# Patient Record
Sex: Male | Born: 1995 | Race: White | Hispanic: No | Marital: Single | Smoking: Current every day smoker
Health system: Southern US, Community
[De-identification: ages and names within clinical notes are randomized; demographics above are authoritative.]

## PROBLEM LIST (undated history)

## (undated) DIAGNOSIS — F329 Major depressive disorder, single episode, unspecified: Secondary | ICD-10-CM

## (undated) DIAGNOSIS — F32A Depression, unspecified: Secondary | ICD-10-CM

## (undated) DIAGNOSIS — F988 Other specified behavioral and emotional disorders with onset usually occurring in childhood and adolescence: Secondary | ICD-10-CM

## (undated) HISTORY — DX: Major depressive disorder, single episode, unspecified: F32.9

## (undated) HISTORY — DX: Other specified behavioral and emotional disorders with onset usually occurring in childhood and adolescence: F98.8

## (undated) HISTORY — DX: Depression, unspecified: F32.A

---
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
## 2016-05-15 ENCOUNTER — Encounter: Payer: Self-pay | Admitting: Pediatrics

## 2016-05-15 ENCOUNTER — Ambulatory Visit (INDEPENDENT_AMBULATORY_CARE_PROVIDER_SITE_OTHER): Payer: Medicaid Other | Admitting: Pediatrics

## 2016-05-15 VITALS — BP 114/76 | HR 94 | Temp 97.7°F | Ht 68.0 in | Wt 285.6 lb

## 2016-05-15 DIAGNOSIS — R234 Changes in skin texture: Secondary | ICD-10-CM | POA: Diagnosis not present

## 2016-05-15 NOTE — Patient Instructions (Signed)
Use benadryl 25 mg two times a day for the swelling Can also try generic anti-histamine allergy medication, ibuprofen 400mg  three times a day if swelling If eye redness worsens, swelling worsens, come back to be seen

## 2016-05-15 NOTE — Progress Notes (Signed)
Subjective:   Patient ID: Bruce Snyder, male    DOB: 19-Nov-1995, 20 y.o.   MRN: [id number] CC: New Patient (Initial Visit) and Knot on forehead  HPI: Bruce Snyder is a 20 y.o. male presenting for New Patient (Initial Visit) and Knot on forehead  Knot not there when he woke up Appeared in the afternoon Itching when it first came on Had similar bumps a month ago No fevers No URI symptoms Generally feelin gfine This morning when he woke up eye was swollen almost shut but much better now No longer itchy Did scratch at it yesterday  Generally healthy Mood has been fine  Does snore, no breathing pauses per mom Graduated high school 2017 Not working yet  Depression screen PHQ 2/9 05/15/2016  Decreased Interest 0  Down, Depressed, Hopeless 0  PHQ - 2 Score 0                                                      Past Medical History:  Diagnosis Date  . ADD (attention deficit disorder)   . Depression    History reviewed. No pertinent family history. Social History   Social History  . Marital status: Single    Spouse name: N/A  . Number of children: N/A  . Years of education: N/A   Social History Main Topics  . Smoking status: Current Every Day Smoker    Packs/day: 0.25    Types: Cigarettes  . Smokeless tobacco: Never Used  . Alcohol use No  . Drug use: No  . Sexual activity: Not Asked   Other Topics Concern  . None   Social History Narrative  . None   ROS: All systems negative other than what is in HPI  Objective:    BP 114/76   Pulse 94   Temp 97.7 F (36.5 C) (Oral)   Ht 5\' 8"  (1.727 m)   Wt 285 lb 9.6 oz (129.5 kg)   BMI 43.43 kg/m   Wt Readings from Last 3 Encounters:  05/15/16 285 lb 9.6 oz (129.5 kg) (>99 %, Z > 2.33)*   * Growth percentiles are based on CDC 2-20 Years data.    Gen: NAD, alert, cooperative with exam, NCAT EYES: EOMI, no conjunctival injection, or no icterus ENT:  TMs pearly gray b/l, OP without erythema, tonsils present LYMPH:  no cervical LAD CV: NRRR, normal S1/S2, no murmur, distal pulses 2+ b/l Resp: CTABL, no wheezes, normal WOB Neuro: Alert and oriented, strength equal b/l UE and LE, coordination grossly normal MSK: normal muscle bulk Skin: upper R forehead over eye brow with two separate areas, one apprx 1 cm across closest to eye brow, above that one less than 1 cm, superficial induration, no fluctuance, no tenderness. No hardness or redness. Shallow abrasion vs excoriation present lower area of induration.  4mm red scab below middle of lower lip  Assessment & Plan:  Smayan was seen today for new patient (initial visit) and knot on forehead.  Diagnoses and all orders for this visit:  Induration of skin Likely a bug bite given itching when he first noticed it Swelling much improved now Has not used anything on it Does not appear infected Normal EOM, no pain with eye ROM Can try benadryl, ibuprofen If redness, swelling return or worsen, needs to be seen to rul eout  pre-septal cellulitis  Follow up plan: Return in about 4 weeks (around 06/12/2016) for CPE. Rex Krasarol Tahira Olivarez, MD [facility]

## 2016-06-16 ENCOUNTER — Encounter: Payer: Medicaid Other | Admitting: Pediatrics

## 2016-12-04 ENCOUNTER — Ambulatory Visit: Payer: Medicaid Other | Admitting: Pediatrics

## 2016-12-05 ENCOUNTER — Encounter: Payer: Self-pay | Admitting: Pediatrics

## 2016-12-10 ENCOUNTER — Ambulatory Visit: Payer: Medicaid Other | Admitting: Pediatrics

## 2016-12-18 ENCOUNTER — Ambulatory Visit: Payer: Medicaid Other | Admitting: Pediatrics

## 2016-12-18 ENCOUNTER — Ambulatory Visit (INDEPENDENT_AMBULATORY_CARE_PROVIDER_SITE_OTHER): Payer: Medicaid Other | Admitting: Pediatrics

## 2016-12-18 ENCOUNTER — Encounter: Payer: Self-pay | Admitting: Pediatrics

## 2016-12-18 VITALS — BP 115/74 | HR 76 | Temp 98.2°F | Ht 68.0 in | Wt 299.2 lb

## 2016-12-18 DIAGNOSIS — R030 Elevated blood-pressure reading, without diagnosis of hypertension: Secondary | ICD-10-CM | POA: Diagnosis not present

## 2016-12-18 DIAGNOSIS — F819 Developmental disorder of scholastic skills, unspecified: Secondary | ICD-10-CM | POA: Diagnosis not present

## 2016-12-18 NOTE — Progress Notes (Signed)
  Subjective:   Patient ID: Bruce Snyder, male    DOB: 1996/06/16, 21 y.o.   MRN: [id number] CC: SSI form  HPI: Bruce BladesHunter Mullenax is a 21 y.o. male presenting for SSI form  Here today with aunt Has h/o ADHD, learning disability Graduated from high school Is looking for jobs Says he is able to read, just reads slowly sometimes Did fine in math Mom had been receiving his social security check per aunt for past years but pt hasnt been getting it He recently switched his payee to his aunt, she is here today with him She does not have any reservations about him receiving his own check and managing his finances. Pt living with girlfriends mom, has his own room there He says she has been helping teach them how to make a budget He is planning on using money to pay rent, groceries Has been grocery shopping regularly Doesn't have drivers license but just recently pick up book to study to be able to pass test planning to apply for more jobs upcoming weeks Pt was living with his mom and boyfriend while in highschool aunt says she is disappointed with how little mom did to prepare Durene CalHunter for the real world. She says that she is available at any time if he needs help. He is planning on setting up bank account for the checks, GF's mom has said she would help   Relevant past medical, surgical, family and social history reviewed. Allergies and medications reviewed and updated. History  Smoking Status  . Current Every Day Smoker  . Packs/day: 0.25  . Types: Cigarettes  Smokeless Tobacco  . Never Used   ROS: Per HPI   Objective:    BP 115/74   Pulse 76   Temp 98.2 F (36.8 C) (Oral)   Ht 5\' 8"  (1.727 m)   Wt 299 lb 3.2 oz (135.7 kg)   BMI 45.49 kg/m   Wt Readings from Last 3 Encounters:  12/18/16 299 lb 3.2 oz (135.7 kg)  05/15/16 285 lb 9.6 oz (129.5 kg) (>99 %, Z= 2.90)*   * Growth percentiles are based on CDC 2-20 Years data.    Gen: NAD, alert, cooperative with exam, NCAT EYES: EOMI,  no conjunctival injection, or no icterus CV: WWP Resp: normal WOB Neuro: Alert and oriented x 4, strength equal b/l UE and LE, coordination grossly normal MSK: normal muscle bulk  Assessment & Plan:  Durene CalHunter was seen today for ssi form.  Diagnoses and all orders for this visit:  Learning disability Pt has demonstrated appropriate use of money with money he has access to per his aunt who has been payee for him for the past month Demonstrates basic simple math skills, reading skills Learning disability in school, but passed reading and math classes Pt with support from aunt and girlfriend's mom who he and she trust to help him  Aunt with no reservations about him being payee of his SSI check As far as am I able to evaluate in clinic, pt has the competency and appropriate support to be able to handle his own finances, planning to set up bank account  Elevated blood pressure reading Improved with recheck Due for CPE rtc 2 weeks  Follow up plan: Return in about 2 weeks (around 01/01/2017) for complete physical. Rex Krasarol Ryana Montecalvo, MD [facility]

## 2017-01-05 ENCOUNTER — Ambulatory Visit: Payer: Medicaid Other | Admitting: Pediatrics

## 2017-01-29 ENCOUNTER — Encounter (HOSPITAL_COMMUNITY): Payer: Self-pay | Admitting: Emergency Medicine

## 2017-01-29 ENCOUNTER — Emergency Department (HOSPITAL_COMMUNITY)
Admission: EM | Admit: 2017-01-29 | Discharge: 2017-01-30 | Disposition: A | Discharge status: Home / Self Care | Payer: Medicaid Other | Attending: Emergency Medicine | Admitting: Emergency Medicine

## 2017-01-29 DIAGNOSIS — T679XXA Effect of heat and light, unspecified, initial encounter: Secondary | ICD-10-CM

## 2017-01-29 DIAGNOSIS — R42 Dizziness and giddiness: Secondary | ICD-10-CM | POA: Diagnosis present

## 2017-01-29 DIAGNOSIS — F909 Attention-deficit hyperactivity disorder, unspecified type: Secondary | ICD-10-CM | POA: Insufficient documentation

## 2017-01-29 DIAGNOSIS — F17210 Nicotine dependence, cigarettes, uncomplicated: Secondary | ICD-10-CM | POA: Insufficient documentation

## 2017-01-29 LAB — CBG MONITORING, ED: GLUCOSE-CAPILLARY: 167 mg/dL — AB (ref 65–99)

## 2017-01-29 NOTE — ED Triage Notes (Signed)
Pt c/o sudden onset of dizziness and sweating while walking today. Pt states he sat down and thought he was going to pass out. Pt denies any symptoms at this time.

## 2017-01-30 LAB — BASIC METABOLIC PANEL
Anion gap: 12 (ref 5–15)
BUN: 9 mg/dL (ref 6–20)
CHLORIDE: 103 mmol/L (ref 101–111)
CO2: 24 mmol/L (ref 22–32)
Calcium: 9.2 mg/dL (ref 8.9–10.3)
Creatinine, Ser: 0.92 mg/dL (ref 0.61–1.24)
GFR calc Af Amer: >60 mL/min (ref >60)
GFR calc non Af Amer: >60 mL/min (ref >60)
GLUCOSE: 104 mg/dL — AB (ref 65–99)
POTASSIUM: 3.9 mmol/L (ref 3.5–5.1)
Sodium: 139 mmol/L (ref 135–145)

## 2017-01-30 LAB — CBC
HEMATOCRIT: 40.9 % (ref 39.0–52.0)
Hemoglobin: 14 g/dL (ref 13.0–17.0)
MCH: 29.4 pg (ref 26.0–34.0)
MCHC: 34.2 g/dL (ref 30.0–36.0)
MCV: 85.9 fL (ref 78.0–100.0)
Platelets: 214 10*3/uL (ref 150–400)
RBC: 4.76 MIL/uL (ref 4.22–5.81)
RDW: 14.1 % (ref 11.5–15.5)
WBC: 7.6 10*3/uL (ref 4.0–10.5)

## 2017-01-30 LAB — TROPONIN I: Troponin I: <0.03 ng/mL (ref <0.03)

## 2017-01-30 NOTE — ED Provider Notes (Signed)
[facility] DEPT Provider Note   CSN: [id number] Arrival date & time: 01/29/17  1937  Time seen 23:42 PM    History   Chief Complaint Chief Complaint  Patient presents with  . Dizziness    HPI Bruce Snyder is a 21 y.o. male.  HPI  patient states he felt fine all day. About 7 PM he was walking with a stepbrother to a location they walk to on a regular basis and he started feeling lightheaded and weak with nausea. His stepbrother states his face was red and he was diaphoretic. They went in to a drugstore to cool off and he drank a regular soda and slowly started feeling better. He states he had eaten earlier in the day. He had some nausea but not now, he denies shortness of breath, chest pain, headache, numbness or tingling of his extremities. He states it lasted for several minutes. He states he's never had that happen before.  PCP Johna SheriffVincent, Carol L, MD   Past Medical History:  Diagnosis Date  . ADD (attention deficit disorder)   . Depression     Patient Active Problem List   Diagnosis Date Noted  . 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 No pertinent surgical history.     Home Medications    none  Prior to Admission medications   Not on File    Family History No family history on file.  Social History Social History  Substance Use Topics  . Smoking status: Current Every Day Smoker    Packs/day: 0.25    Types: Cigarettes  . Smokeless tobacco: Never Used  . Alcohol use No  on disability for ADHD and ADD, possible learning disability and ? bipolar   Allergies   Patient has no known allergies.   Review of Systems Review of Systems  All other systems reviewed and are negative.    Physical Exam Updated Vital Signs BP (!) 158/87   Pulse 91   Temp 97.7 F (36.5 C)   Resp (!) 21   Ht 5\' 8"  (1.727 m)   Wt 135.6 kg (299 lb)   SpO2 98%   BMI 45.46 kg/m   Vital signs normal except for  hypertension  Orthostatic VS for the past 24 hrs:  BP- Lying Pulse- Lying BP- Sitting Pulse- Sitting BP- Standing at 0 minutes Pulse- Standing at 0 minutes  01/30/17 0204 151/77 75 (!) 164/109 76 (!) 154/117 95     Laboratory interpretation all normal except For hypertension    Physical Exam  Constitutional: He is oriented to person, place, and time. He appears well-developed and well-nourished.  Non-toxic appearance. He does not appear ill. No distress.  HENT:  Head: Normocephalic and atraumatic.  Right Ear: External ear normal.  Left Ear: External ear normal.  Nose: Nose normal. No mucosal edema or rhinorrhea.  Mouth/Throat: Oropharynx is clear and moist and mucous membranes are normal. No dental abscesses or uvula swelling.  Eyes: Pupils are equal, round, and reactive to light. Conjunctivae and EOM are normal.  Neck: Normal range of motion and full passive range of motion without pain. Neck supple.  Cardiovascular: Normal rate, regular rhythm and normal heart sounds.  Exam reveals no gallop and no friction rub.   No murmur heard. Pulmonary/Chest: Effort normal and breath sounds normal. No respiratory distress. He has no wheezes. He has no rhonchi. He has no rales. He exhibits no tenderness and no crepitus.  Abdominal: Soft. Normal appearance and bowel  sounds are normal. He exhibits no distension. There is no tenderness. There is no rebound and no guarding.  Musculoskeletal: Normal range of motion. He exhibits no edema or tenderness.  Moves all extremities well.   Neurological: He is alert and oriented to person, place, and time. He has normal strength. No cranial nerve deficit.  Skin: Skin is warm, dry and intact. No rash noted. No erythema. No pallor.  Psychiatric: He has a normal mood and affect. His speech is normal and behavior is normal. His mood appears not anxious.  Nursing note and vitals reviewed.    ED Treatments / Results  Labs (all labs ordered are listed, but only  abnormal results are displayed) Results for orders placed or performed during the hospital encounter of 01/29/17  Basic metabolic panel  Result Value Ref Range   Sodium 139 135 - 145 mmol/L   Potassium 3.9 3.5 - 5.1 mmol/L   Chloride 103 101 - 111 mmol/L   CO2 24 22 - 32 mmol/L   Glucose, Bld 104 (H) 65 - 99 mg/dL   BUN 9 6 - 20 mg/dL   Creatinine, Ser 4.09 0.61 - 1.24 mg/dL   Calcium 9.2 8.9 - 81.1 mg/dL   GFR calc non Af Amer >60 >60 mL/min   GFR calc Af Amer >60 >60 mL/min   Anion gap 12 5 - 15  CBC  Result Value Ref Range   WBC 7.6 4.0 - 10.5 K/uL   RBC 4.76 4.22 - 5.81 MIL/uL   Hemoglobin 14.0 13.0 - 17.0 g/dL   HCT 91.4 78.2 - 95.6 %   MCV 85.9 78.0 - 100.0 fL   MCH 29.4 26.0 - 34.0 pg   MCHC 34.2 30.0 - 36.0 g/dL   RDW 21.3 08.6 - 57.8 %   Platelets 214 150 - 400 K/uL  Troponin I  Result Value Ref Range   Troponin I <0.03 <0.03 ng/mL  CBG monitoring, ED  Result Value Ref Range   Glucose-Capillary 167 (H) 65 - 99 mg/dL   Laboratory interpretation all normal     EKG  EKG Interpretation  Date/Time:  Thursday January 29 2017 19:57:53 EDT Ventricular Rate:  99 PR Interval:  140 QRS Duration: 86 QT Interval:  320 QTC Calculation: 410 R Axis:   33 Text Interpretation:  Sinus rhythm with marked sinus arrhythmia Otherwise normal ECG No old tracing to compare Confirmed by Devoria Albe ([id number]) on 01/29/2017 11:00:23 PM       Radiology No results found.  Procedures Procedures (including critical care time)  Medications Ordered in ED Medications - No data to display   Initial Impression / Assessment and Plan / ED Course  I have reviewed the triage vital signs and the nursing notes.  Pertinent labs & imaging results that were available during my care of the patient were reviewed by me and considered in my medical decision making (see chart for details).  Pt states he does not want an IV or IV fluids.   Pt continued to do well in the ED and was ambulatory  without distress. I suspect he was walking in the heat and got a little overheated.   Final Clinical Impressions(s) / ED Diagnoses   Final diagnoses:  Dizziness  Heat effects of, initial encounter    New Prescriptions None  Plan discharge  Devoria Albe, MD, Concha Pyo, MD 01/30/17 [phone]

## 2017-01-30 NOTE — Discharge Instructions (Signed)
Drink plenty of fluids especially when out in the heat. Recheck as needed.

## 2017-02-05 ENCOUNTER — Encounter (HOSPITAL_COMMUNITY): Payer: Self-pay | Admitting: *Deleted

## 2017-02-05 ENCOUNTER — Emergency Department (HOSPITAL_COMMUNITY)
Admission: EM | Admit: 2017-02-05 | Discharge: 2017-02-05 | Disposition: A | Discharge status: Home / Self Care | Payer: Medicaid Other | Attending: Emergency Medicine | Admitting: Emergency Medicine

## 2017-02-05 DIAGNOSIS — F909 Attention-deficit hyperactivity disorder, unspecified type: Secondary | ICD-10-CM | POA: Insufficient documentation

## 2017-02-05 DIAGNOSIS — R21 Rash and other nonspecific skin eruption: Secondary | ICD-10-CM | POA: Insufficient documentation

## 2017-02-05 DIAGNOSIS — F17210 Nicotine dependence, cigarettes, uncomplicated: Secondary | ICD-10-CM | POA: Insufficient documentation

## 2017-02-05 MED ORDER — ACYCLOVIR 800 MG PO TABS
800.0000 mg | ORAL_TABLET | Freq: Once | ORAL | Status: AC
  Administered 2017-02-05: 800 mg via ORAL
  Filled 2017-02-05: qty 1

## 2017-02-05 MED ORDER — SULFAMETHOXAZOLE-TRIMETHOPRIM 800-160 MG PO TABS: 1.0000 | ORAL_TABLET | Freq: Two times a day (BID) | ORAL | 0 refills | Status: AC

## 2017-02-05 MED ORDER — ACYCLOVIR 800 MG PO TABS: 800.0000 mg | ORAL_TABLET | Freq: Every day | ORAL | 0 refills | Status: DC

## 2017-02-05 NOTE — ED Triage Notes (Signed)
Swelling of forehead

## 2017-02-05 NOTE — Discharge Instructions (Signed)
Someone from the hospital will contact you with the culture results.  Call the dermatologist listed to arrange a follow-up appt.

## 2017-02-05 NOTE — ED Provider Notes (Signed)
[facility] Provider Note   CSN: [id number] Arrival date & time: 02/05/17  1709     History   Chief Complaint Chief Complaint  Patient presents with  . Facial Swelling    HPI Bruce Snyder is a 21 y.o. male.  HPI   Bruce Snyder is a 21 y.o. male who presents to the Emergency Department complaining of tenderness and swelling of the left side of his forehead for several days.   Also complains of having "bumps" to the same area.  He states this is a reoccurring problem, but has never had it to last this long before.  Rash typically occurs in same area.  Denies visual changes, fever, malaise, neck pain and headaches.  Past Medical History:  Diagnosis Date  . ADD (attention deficit disorder)   . Depression     Patient Active Problem List   Diagnosis Date Noted  . 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 No pertinent surgical history.     Home Medications    Prior to Admission medications   Not on File    Family History No family history on file.  Social History Social History  Substance Use Topics  . Smoking status: Current Every Day Smoker    Packs/day: 0.25    Types: Cigarettes  . Smokeless tobacco: Never Used  . Alcohol use No     Allergies   Patient has no known allergies.   Review of Systems Review of Systems  Constitutional: Negative for activity change, appetite change, chills and fever.  HENT: Negative for facial swelling, sore throat and trouble swallowing.   Respiratory: Negative for chest tightness, shortness of breath and wheezing.   Musculoskeletal: Negative for neck pain and neck stiffness.  Skin: Positive for rash (left forehead). Negative for wound.  Neurological: Negative for dizziness, weakness, numbness and headaches.  Hematological: Negative for adenopathy.  All other systems reviewed and are negative.    Physical Exam Updated Vital Signs BP (!) 147/93   Pulse (!) 106   Temp  97.6 F (36.4 C) (Oral)   Resp 20   Ht 5\' 8"  (1.727 m)   Wt 135.6 kg (299 lb)   SpO2 98%   BMI 45.46 kg/m   Physical Exam  Constitutional: He is oriented to person, place, and time. He appears well-developed and well-nourished. No distress.  HENT:  Head: Normocephalic and atraumatic.  Mouth/Throat: Oropharynx is clear and moist.  Mild edema of the left forehead with several small, mostly crusting lesions just distal and within the hairline   Neck: Normal range of motion. Neck supple.  Cardiovascular: Normal rate, regular rhythm, normal heart sounds and intact distal pulses.   No murmur heard. Pulmonary/Chest: Effort normal and breath sounds normal. No respiratory distress.  Musculoskeletal: He exhibits no edema or tenderness.  Lymphadenopathy:    He has no cervical adenopathy.  Neurological: He is alert and oriented to person, place, and time. He exhibits normal muscle tone. Coordination normal.  Skin: Skin is warm.  Nursing note and vitals reviewed.    ED Treatments / Results  Labs (all labs ordered are listed, but only abnormal results are displayed) Labs Reviewed - No data to display  EKG  EKG Interpretation None       Radiology No results found.  Procedures Procedures (including critical care time)  Medications Ordered in ED Medications - No data to display   Initial Impression / Assessment and Plan / ED Course  I have  reviewed the triage vital signs and the nursing notes.  Pertinent labs & imaging results that were available during my care of the patient were reviewed by me and considered in my medical decision making (see chart for details).     Focal rash to the left forehead, recurrent by hx.  Mild edema w/o induration.  No eye or lower face involvement.  Rash appears c/w HSV.   Culture obtained.   Final Clinical Impressions(s) / ED Diagnoses   Final diagnoses:  Rash of face    New Prescriptions Discharge Medication List as of 02/05/2017  5:58  PM    START taking these medications   Details  acyclovir (ZOVIRAX) 800 MG tablet Take 1 tablet (800 mg total) by mouth 5 (five) times daily., Starting Thu 02/05/2017, Print    sulfamethoxazole-trimethoprim (BACTRIM DS,SEPTRA DS) 800-160 MG tablet Take 1 tablet by mouth 2 (two) times daily., Starting Thu 02/05/2017, Until Thu 02/12/2017, Print         [place], [place], PA-C 02/07/17 1851    Mancel Bale, MD 02/07/17 2317

## 2017-02-08 LAB — HSV CULTURE AND TYPING

## 2017-05-27 ENCOUNTER — Emergency Department (HOSPITAL_COMMUNITY)
Admission: EM | Admit: 2017-05-27 | Discharge: 2017-05-27 | Disposition: A | Discharge status: Home / Self Care | Payer: Medicaid Other | Attending: Emergency Medicine | Admitting: Emergency Medicine

## 2017-05-27 ENCOUNTER — Encounter (HOSPITAL_COMMUNITY): Payer: Self-pay | Admitting: Emergency Medicine

## 2017-05-27 ENCOUNTER — Other Ambulatory Visit: Payer: Self-pay

## 2017-05-27 DIAGNOSIS — Z79899 Other long term (current) drug therapy: Secondary | ICD-10-CM | POA: Insufficient documentation

## 2017-05-27 DIAGNOSIS — L709 Acne, unspecified: Secondary | ICD-10-CM | POA: Diagnosis present

## 2017-05-27 DIAGNOSIS — L739 Follicular disorder, unspecified: Secondary | ICD-10-CM

## 2017-05-27 DIAGNOSIS — F17210 Nicotine dependence, cigarettes, uncomplicated: Secondary | ICD-10-CM | POA: Diagnosis not present

## 2017-05-27 DIAGNOSIS — F909 Attention-deficit hyperactivity disorder, unspecified type: Secondary | ICD-10-CM | POA: Diagnosis not present

## 2017-05-27 MED ORDER — SULFAMETHOXAZOLE-TRIMETHOPRIM 800-160 MG PO TABS: 1.0000 | ORAL_TABLET | Freq: Two times a day (BID) | ORAL | 0 refills | Status: AC

## 2017-05-27 NOTE — ED Triage Notes (Signed)
Pt c/o bumps to the scalp.

## 2017-05-27 NOTE — Discharge Instructions (Signed)
Try washing your hair at least one a week with Selsun Blue shampoo.  Call the dermatologist listed to arrange a follow-up appt if not improving

## 2017-05-29 NOTE — ED Provider Notes (Signed)
[facility]Y [facility]PART[facility] Provider Note   CSN: [id number] Arrival date & time: 05/27/17  [id number]     History   Chief Complaint Chief Complaint  Patient presents with  . Acne    HPI Bruce Snyder is a 21 y.o. male.  HPI   Bruce Snyder is a 21 y.o. male who presents to the Emergency Department complaining of "bumps" to his scalp.  Symptoms present for several days.  He stats this is a recurrent problem.  He was seen here during the summer for same and symptoms resolved after antibiotics.  He describes the bumps as painful.  No new shampoo or other hair products.  No fever, swelling, neck pain or itching.    Past Medical History:  Diagnosis Date  . ADD (attention deficit disorder)   . Depression     Patient Active Problem List   Diagnosis Date Noted  . 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 No pertinent surgical history.     Home Medications    Prior to Admission medications   Medication Sig Start Date End Date Taking? Authorizing Provider  acyclovir (ZOVIRAX) 800 MG tablet Take 1 tablet (800 mg total) by mouth 5 (five) times daily. 02/05/17   Chares Slaymaker, PA-C  sulfamethoxazole-trimethoprim (BACTRIM DS,SEPTRA DS) 800-160 MG tablet Take 1 tablet 2 (two) times daily for 7 days by mouth. 05/27/17 06/03/17  Pauline Ausriplett, Ulys Favia, PA-C    Family History No family history on file.  Social History Social History   Tobacco Use  . Smoking status: Current Every Day Smoker    Packs/day: 0.25    Types: Cigarettes  . Smokeless tobacco: Never Used  Substance Use Topics  . Alcohol use: Yes  . Drug use: No     Allergies   Patient has no known allergies.   Review of Systems Review of Systems  Constitutional: Negative for activity change, appetite change, chills and fever.  HENT: Negative for facial swelling, sore throat and trouble swallowing.   Respiratory: Negative for chest tightness, shortness of breath and wheezing.     Musculoskeletal: Negative for neck pain and neck stiffness.  Skin: Positive for rash. Negative for wound.  Neurological: Negative for dizziness, weakness, numbness and headaches.  Hematological: Negative for adenopathy.  All other systems reviewed and are negative.    Physical Exam Updated Vital Signs BP 129/84 (BP Location: Left Arm)   Pulse (!) 103   Temp 98.1 F (36.7 C)   Resp 20   Ht 5\' 8"  (1.727 m)   Wt 136.1 kg (300 lb)   SpO2 97%   BMI 45.61 kg/m   Physical Exam  Constitutional: He appears well-developed and well-nourished. No distress.  HENT:  Head: Normocephalic and atraumatic.  Mouth/Throat: Oropharynx is clear and moist.  Few scattered small pustules of the scalp.  No scaly plaques.  No alopecia  Neck: Normal range of motion. Neck supple.  Cardiovascular: Normal rate, regular rhythm and intact distal pulses.  Pulmonary/Chest: Effort normal and breath sounds normal. No respiratory distress.  Musculoskeletal: He exhibits no edema or tenderness.  Lymphadenopathy:    He has no cervical adenopathy.  Neurological: He is alert. No sensory deficit.  Skin: Skin is warm. No rash noted. No erythema.  Nursing note and vitals reviewed.    ED Treatments / Results  Labs (all labs ordered are listed, but only abnormal results are displayed) Labs Reviewed - No data to display  EKG  EKG Interpretation None  Radiology No results found.  Procedures Procedures (including critical care time)  Medications Ordered in ED Medications - No data to display   Initial Impression / Assessment and Plan / ED Course  I have reviewed the triage vital signs and the nursing notes.  Pertinent labs & imaging results that were available during my care of the patient were reviewed by me and considered in my medical decision making (see chart for details).     Patient well-appearing.  Few scattered small pustules of the scalp appear consistent with a mild folliculitis.   No scaly plaques or concerning symptoms for tinea.  Will treat with Bactrim and referral information given for dermatology.  Final Clinical Impressions(s) / ED Diagnoses   Final diagnoses:  Folliculitis    ED Discharge Orders        Ordered    sulfamethoxazole-trimethoprim (BACTRIM DS,SEPTRA DS) 800-160 MG tablet  2 times daily     05/27/17 2000       Pauline Ausriplett, Shamyah Stantz, PA-C 05/29/17 0056    Mesner, Barbara CowerJason, MD 05/30/17 [phone]

## 2017-06-18 ENCOUNTER — Emergency Department (HOSPITAL_COMMUNITY)
Admission: EM | Admit: 2017-06-18 | Discharge: 2017-06-18 | Disposition: A | Discharge status: Home / Self Care | Payer: Medicaid Other | Attending: Emergency Medicine | Admitting: Emergency Medicine

## 2017-06-18 ENCOUNTER — Other Ambulatory Visit: Payer: Self-pay

## 2017-06-18 ENCOUNTER — Encounter (HOSPITAL_COMMUNITY): Payer: Self-pay | Admitting: Emergency Medicine

## 2017-06-18 DIAGNOSIS — F909 Attention-deficit hyperactivity disorder, unspecified type: Secondary | ICD-10-CM | POA: Insufficient documentation

## 2017-06-18 DIAGNOSIS — H9202 Otalgia, left ear: Secondary | ICD-10-CM | POA: Insufficient documentation

## 2017-06-18 DIAGNOSIS — F17210 Nicotine dependence, cigarettes, uncomplicated: Secondary | ICD-10-CM | POA: Insufficient documentation

## 2017-06-18 DIAGNOSIS — Z79899 Other long term (current) drug therapy: Secondary | ICD-10-CM | POA: Diagnosis not present

## 2017-06-18 MED ORDER — ANTIPYRINE-BENZOCAINE 5.4-1.4 % OT SOLN: 3.0000 [drp] | OTIC | 0 refills | Status: DC | PRN

## 2017-06-18 NOTE — ED Triage Notes (Signed)
Lt ear pain that started today  

## 2017-06-18 NOTE — Discharge Instructions (Signed)
Follow-up your your primary doctor if needed.

## 2017-06-18 NOTE — ED Provider Notes (Signed)
[facility] EMERGENCY DEPARTMENT Provider Note   CSN: [id number] Arrival date & time: 06/18/17  1323     History   Chief Complaint Chief Complaint  Patient presents with  . Otalgia    HPI Bruce Snyder is a 21 y.o. male.  HPI   Bruce Snyder is a 21 y.o. male who presents to the Emergency Department complaining of left ear pain that started earlier today.  He states that he felt like he had something in his ear and when he went to scratch his ear, he noticed a small spider that came out onto his finger.  He complains of mild pain to his ear since that time.  He denies headaches, dizziness, fever, recent cough or congestion or decreased hearing   Past Medical History:  Diagnosis Date  . ADD (attention deficit disorder)   . Depression     Patient Active Problem List   Diagnosis Date Noted  . 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 No pertinent surgical history.     Home Medications    Prior to Admission medications   Medication Sig Start Date End Date Taking? Authorizing Provider  acyclovir (ZOVIRAX) 800 MG tablet Take 1 tablet (800 mg total) by mouth 5 (five) times daily. 02/05/17   Abdulai Blaylock, PA-C  antipyrine-benzocaine Lyla Son(AURALGAN) OTIC solution Place 3-4 drops into the left ear every 2 (two) hours as needed for ear pain. 06/18/17   Pauline Ausriplett, Kiaria Quinnell, PA-C    Family History No family history on file.  Social History Social History   Tobacco Use  . Smoking status: Current Every Day Smoker    Packs/day: 0.25    Types: Cigarettes  . Smokeless tobacco: Never Used  Substance Use Topics  . Alcohol use: Yes  . Drug use: No     Allergies   Patient has no known allergies.   Review of Systems Review of Systems  Constitutional: Negative for chills, fatigue and fever.  HENT: Positive for ear pain. Negative for congestion, ear discharge, facial swelling, hearing loss, sore throat and trouble swallowing.     Respiratory: Negative for cough.   Gastrointestinal: Negative for abdominal pain, nausea and vomiting.  Musculoskeletal: Negative for myalgias.  Skin: Negative for rash.  Neurological: Negative for dizziness, facial asymmetry, weakness, numbness and headaches.  Hematological: Does not bruise/bleed easily.     Physical Exam Updated Vital Signs BP 127/82 (BP Location: Left Arm)   Pulse 97   Temp 98.2 F (36.8 C) (Oral)   Resp 19   Ht 5\' 8"  (1.727 m)   Wt 136.1 kg (300 lb)   SpO2 97%   BMI 45.61 kg/m   Physical Exam  Constitutional: He is oriented to person, place, and time. He appears well-developed and well-nourished. No distress.  HENT:  Head: Normocephalic and atraumatic.  Right Ear: Tympanic membrane and ear canal normal.  Left Ear: Tympanic membrane normal. No lacerations. There is tenderness. No drainage. No mastoid tenderness. Tympanic membrane is not perforated, not erythematous and not bulging.  Mouth/Throat: Uvula is midline, oropharynx is clear and moist and mucous membranes are normal. No uvula swelling. No oropharyngeal exudate.  Mild erythema to the floor of the left ear canal.  TM intact, no foreign bodies.  Neck: Normal range of motion. Neck supple.  Cardiovascular: Normal rate, regular rhythm and intact distal pulses.  No murmur heard. Pulmonary/Chest: Effort normal and breath sounds normal. No stridor. No respiratory distress.  Musculoskeletal: Normal range of motion.  Lymphadenopathy:    He has no cervical adenopathy.  Neurological: He is alert and oriented to person, place, and time. No sensory deficit. Coordination normal.  Skin: Skin is warm and dry. No rash noted.  Nursing note and vitals reviewed.    ED Treatments / Results  Labs (all labs ordered are listed, but only abnormal results are displayed) Labs Reviewed - No data to display  EKG  EKG Interpretation None       Radiology No results found.  Procedures Procedures (including  critical care time)  Medications Ordered in ED Medications - No data to display   Initial Impression / Assessment and Plan / ED Course  I have reviewed the triage vital signs and the nursing notes.  Pertinent labs & imaging results that were available during my care of the patient were reviewed by me and considered in my medical decision making (see chart for details).     Patient is well-appearing.  History of possible foreign body to the ear canal.  Mild abrasion to the floor of the ear canal, but otherwise normal exam of the left ear.  Patient reassured. No AOM. Auralgan drops prescribed if needed.  Final Clinical Impressions(s) / ED Diagnoses   Final diagnoses:  Left ear pain    ED Discharge Orders        Ordered    antipyrine-benzocaine (AURALGAN) OTIC solution  Every 2 houLyla Sonrs PRN     06/18/17 1450       Pauline Ausriplett, Chaunice Obie, PA-C 06/18/17 1500    Linwood DibblesKnapp, Jon, MD 06/20/17 [phone]

## 2017-06-23 ENCOUNTER — Emergency Department (HOSPITAL_COMMUNITY)
Admission: EM | Admit: 2017-06-23 | Discharge: 2017-06-23 | Discharge status: Absent without leave | Payer: Medicaid Other

## 2017-08-05 ENCOUNTER — Emergency Department (HOSPITAL_COMMUNITY)
Admission: EM | Admit: 2017-08-05 | Discharge: 2017-08-06 | Disposition: A | Discharge status: Home / Self Care | Payer: Medicaid Other | Attending: Emergency Medicine | Admitting: Emergency Medicine

## 2017-08-05 ENCOUNTER — Emergency Department (HOSPITAL_COMMUNITY): Payer: Medicaid Other

## 2017-08-05 ENCOUNTER — Encounter (HOSPITAL_COMMUNITY): Payer: Self-pay | Admitting: Emergency Medicine

## 2017-08-05 ENCOUNTER — Other Ambulatory Visit: Payer: Self-pay

## 2017-08-05 DIAGNOSIS — Z79899 Other long term (current) drug therapy: Secondary | ICD-10-CM | POA: Insufficient documentation

## 2017-08-05 DIAGNOSIS — R059 Cough, unspecified: Secondary | ICD-10-CM

## 2017-08-05 DIAGNOSIS — R05 Cough: Secondary | ICD-10-CM | POA: Diagnosis not present

## 2017-08-05 DIAGNOSIS — F17210 Nicotine dependence, cigarettes, uncomplicated: Secondary | ICD-10-CM | POA: Insufficient documentation

## 2017-08-05 MED ORDER — PREDNISONE 50 MG PO TABS
50.0000 mg | ORAL_TABLET | Freq: Once | ORAL | Status: AC
  Administered 2017-08-06: 50 mg via ORAL
  Filled 2017-08-05: qty 1

## 2017-08-05 MED ORDER — BENZONATATE 100 MG PO CAPS
200.0000 mg | ORAL_CAPSULE | Freq: Once | ORAL | Status: AC
  Administered 2017-08-06: 200 mg via ORAL
  Filled 2017-08-05: qty 2

## 2017-08-05 NOTE — ED Triage Notes (Signed)
Pt c/o nonproductive cough that started last night.

## 2017-08-06 MED ORDER — BENZONATATE 100 MG PO CAPS: 100.0000 mg | ORAL_CAPSULE | Freq: Three times a day (TID) | ORAL | 0 refills | Status: AC

## 2017-08-06 MED ORDER — PREDNISONE 50 MG PO TABS: 50.0000 mg | ORAL_TABLET | Freq: Every day | ORAL | 0 refills | Status: AC

## 2017-08-06 NOTE — Discharge Instructions (Addendum)
Please take prednisone for the next 4 days Take Tessalon as needed for cough every 8 hours Return if worsening

## 2017-08-06 NOTE — ED Provider Notes (Signed)
[facility] EMERGENCY DEPARTMENT Provider Note   CSN: [id number] Arrival date & time: 08/05/17  2156   History   Chief Complaint Chief Complaint  Patient presents with  . Cough    HPI Bruce Snyder is a 22 y.o. male who presents with a cough.  No significant past medical history.  Patient states that his been coughing for about a day.  It is not productive.  He denies fever or URI symptoms.  He has had episodes of bronchitis in the past which were similar.  He has taken over-the-counter cough medicine and cough drops with minimal relief.  He is a smoker and has continued to smoke despite frequent coughing.  He is requesting cough medicine in the ED because he is not able to afford a prescription.  He denies shortness of breath.  He has some chest pain and back pain with coughing but not constant pain. No recent surgery/travel/immobilization, hx of cancer, leg swelling, hemptysis, prior DVT/PE, or hormone use.   HPI  Past Medical History:  Diagnosis Date  . ADD (attention deficit disorder)   . Depression     Patient Active Problem List   Diagnosis Date Noted  . 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 No pertinent surgical history.     Home Medications    Prior to Admission medications   Medication Sig Start Date End Date Taking? Authorizing Provider  Throat Lozenges (COUGH DROPS MT) Use as directed 1 lozenge in the mouth or throat daily as needed (for cough and cold).   Yes [provider]  benzonatate (TESSALON) 100 MG capsule Take 1 capsule (100 mg total) by mouth every 8 (eight) hours. 08/06/17   Bethel Born, PA-C  predniSONE (DELTASONE) 50 MG tablet Take 1 tablet (50 mg total) by mouth daily. 08/06/17   Bethel Born, PA-C    Family History No family history on file.  Social History Social History   Tobacco Use  . Smoking status: Current Every Day Smoker    Packs/day: 0.25    Types: Cigarettes  .  Smokeless tobacco: Never Used  Substance Use Topics  . Alcohol use: Yes  . Drug use: No     Allergies   Wool alcohol [lanolin]   Review of Systems Review of Systems  Constitutional: Negative for chills and fever.  HENT: Negative for congestion and rhinorrhea.   Respiratory: Positive for cough. Negative for shortness of breath and wheezing.   Cardiovascular: Positive for chest pain. Negative for leg swelling.     Physical Exam Updated Vital Signs BP 136/67 (BP Location: Right Arm)   Pulse (!) 108   Temp 98.6 F (37 C) (Oral)   Resp 20   Ht 5\' 10"  (1.778 m)   Wt 136.1 kg (300 lb)   SpO2 95%   BMI 43.05 kg/m   Physical Exam  Constitutional: He is oriented to person, place, and time. He appears well-developed and well-nourished. No distress.  Obese. Frequent episodes of dry cough  HENT:  Head: Normocephalic and atraumatic.  Eyes: Conjunctivae are normal. Pupils are equal, round, and reactive to light. Right eye exhibits no discharge. Left eye exhibits no discharge. No scleral icterus.  Neck: Normal range of motion.  Cardiovascular: Tachycardia present. Exam reveals no gallop and no friction rub.  No murmur heard. Pulmonary/Chest: Effort normal and breath sounds normal. No stridor. No respiratory distress. He has no wheezes. He has no rales. He exhibits no tenderness.  Abdominal:  He exhibits no distension.  Neurological: He is alert and oriented to person, place, and time.  Skin: Skin is warm and dry.  Psychiatric: He has a normal mood and affect. His behavior is normal.  Nursing note and vitals reviewed.    ED Treatments / Results  Labs (all labs ordered are listed, but only abnormal results are displayed) Labs Reviewed - No data to display  EKG  EKG Interpretation None       Radiology Dg Chest 2 View  Result Date: 08/05/2017 CLINICAL DATA:  Nonproductive EXAM: CHEST  2 VIEW COMPARISON:  None. FINDINGS: Mild bronchitic changes. No consolidation or  effusion. Normal heart size. No pneumothorax. IMPRESSION: Mild bronchitic changes.  No focal pulmonary infiltrate. Electronically Signed   By: Jasmine PangKim  Fujinaga M.D.   On: 08/05/2017 22:32    Procedures Procedures (including critical care time)  Medications Ordered in ED Medications  predniSONE (DELTASONE) tablet 50 mg (50 mg Oral Given 08/06/17 0005)  benzonatate (TESSALON) capsule 200 mg (200 mg Oral Given 08/06/17 0005)    Initial Impression / Assessment and Plan / ED Course  I have reviewed the triage vital signs and the nursing notes.  Pertinent labs & imaging results that were available during my care of the patient were reviewed by me and considered in my medical decision making (see chart for details).  22 year old male with cough. He is significantly tachycardic in triage. On my evaluation, at rest, he is mildly tachycardic. He denies SOB. He does have chest pain with coughing but it is not constant. He does not have risk factors for PE other than tachycardic. Likely it is due to his coughing which he is doing every 10-20 seconds. CXR shows mild bronchitic changes. He was given a dose of steroid and cough medicine. He was given a rx for the same with coupons. He was given strict return precautions.  Final Clinical Impressions(s) / ED Diagnoses   Final diagnoses:  Cough    ED Discharge Orders        Ordered    benzonatate (TESSALON) 100 MG capsule  Every 8 hours     08/06/17 0024    predniSONE (DELTASONE) 50 MG tablet  Daily     08/06/17 0024       Bethel BornGekas, Laylia Mui Marie, PA-C 08/06/17 0118    Bethel BornGekas, Nabil Bubolz Marie, PA-C 08/06/17 0119    Dione BoozeGlick, David, MD 08/06/17 [phone]

## 2019-01-22 IMAGING — DX DG CHEST 2V
2 series · 2 of 2 positions shown · non-contrast
Comparison: None.

CLINICAL DATA: Nonproductive

EXAM:
CHEST  2 VIEW

[chest pa]
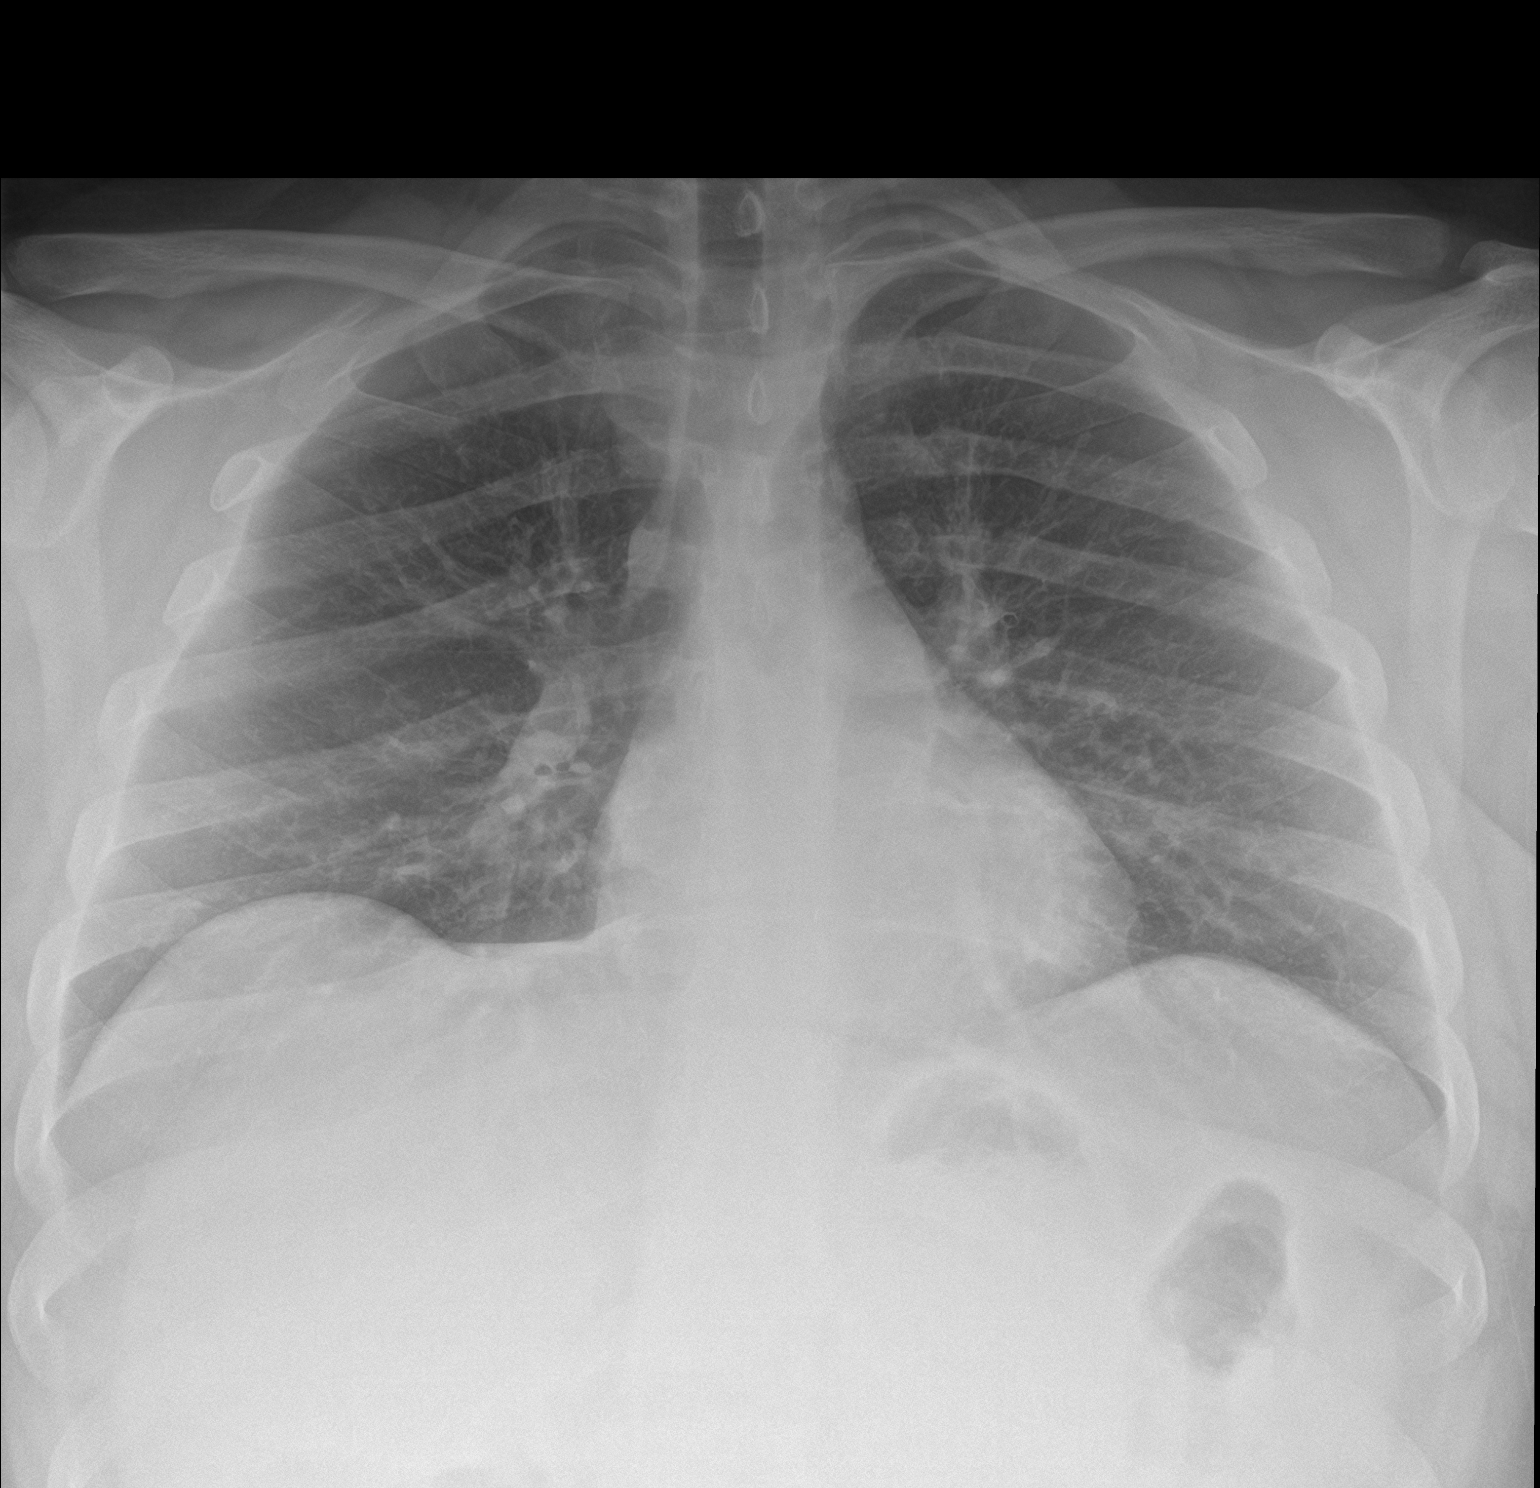

[chest lat]
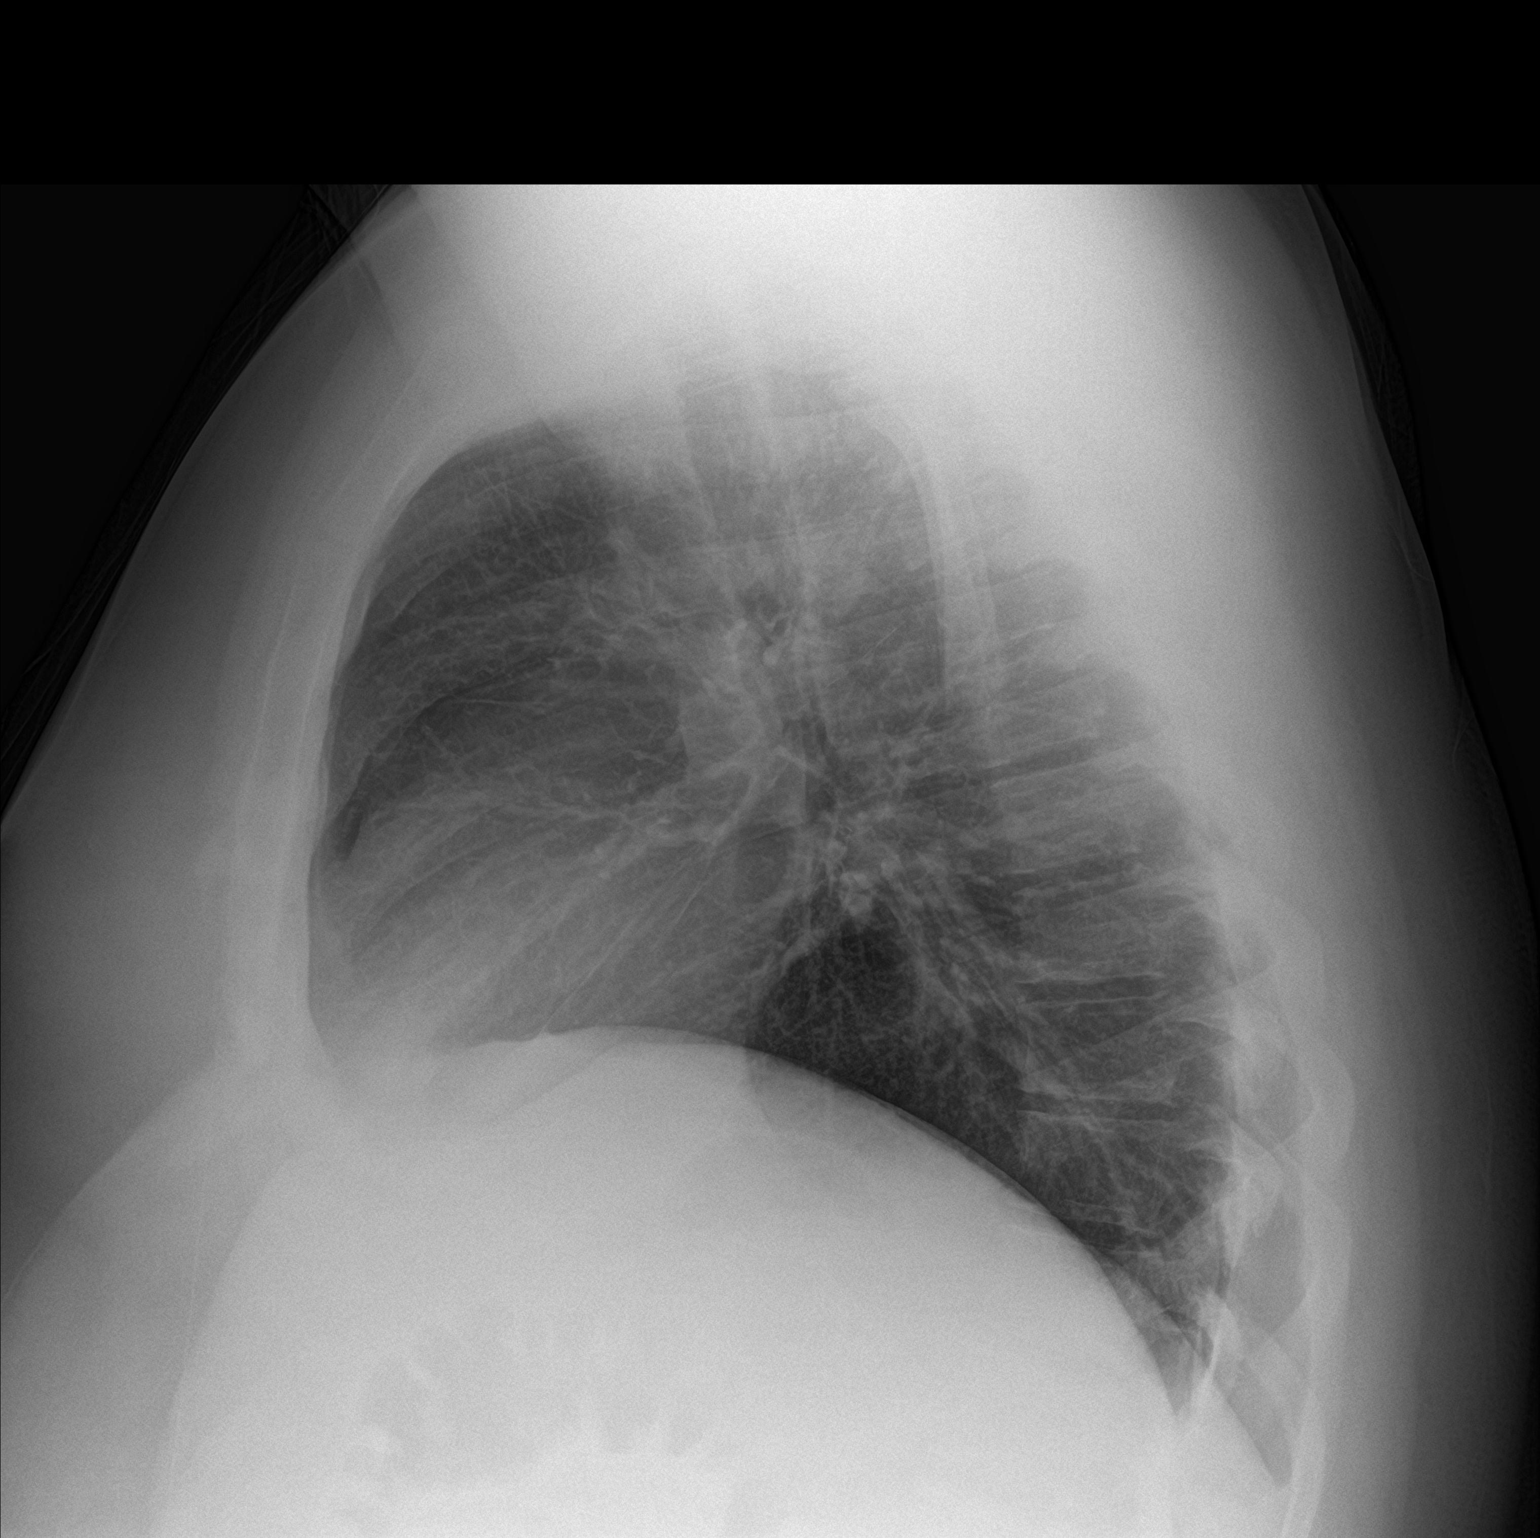

[2 of 2 positions shown; findings below may reference images not displayed]

FINDINGS: Mild bronchitic changes. No consolidation or effusion. Normal heart
size. No pneumothorax.
IMPRESSION: Mild bronchitic changes.  No focal pulmonary infiltrate.

## 2019-07-27 ENCOUNTER — Emergency Department (HOSPITAL_COMMUNITY)
Admission: EM | Admit: 2019-07-27 | Discharge: 2019-07-27 | Disposition: A | Discharge status: Home / Self Care | Payer: Medicaid Other | Attending: Emergency Medicine | Admitting: Emergency Medicine

## 2019-07-27 ENCOUNTER — Encounter (HOSPITAL_COMMUNITY): Payer: Self-pay | Admitting: Emergency Medicine

## 2019-07-27 ENCOUNTER — Other Ambulatory Visit: Payer: Self-pay

## 2019-07-27 DIAGNOSIS — Z5321 Procedure and treatment not carried out due to patient leaving prior to being seen by health care provider: Secondary | ICD-10-CM | POA: Insufficient documentation

## 2019-07-27 DIAGNOSIS — M79604 Pain in right leg: Secondary | ICD-10-CM | POA: Insufficient documentation

## 2019-07-27 NOTE — ED Triage Notes (Signed)
Pt states that he started having right leg pain yesterday and it got worse today he went to urc and was sent here to rule out a DVT.
